# Patient Record
Sex: Male | Born: 1985 | Race: White | Hispanic: No | Marital: Single
Health system: Southern US, Community
[De-identification: ages and names within clinical notes are randomized; demographics above are authoritative.]

---

## 2006-07-29 ENCOUNTER — Emergency Department: Payer: Self-pay | Admitting: Emergency Medicine
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
## 2015-01-31 LAB — HM HIV SCREENING LAB: HM HIV Screening: NEGATIVE

## 2019-07-05 ENCOUNTER — Ambulatory Visit: Payer: Self-pay

## 2019-08-20 ENCOUNTER — Other Ambulatory Visit: Payer: Self-pay

## 2019-08-20 DIAGNOSIS — Z20822 Contact with and (suspected) exposure to covid-19: Secondary | ICD-10-CM

## 2019-08-21 LAB — NOVEL CORONAVIRUS, NAA: SARS-CoV-2, NAA: NOT DETECTED

## 2020-06-19 ENCOUNTER — Emergency Department: Payer: Commercial Managed Care - PPO

## 2020-06-19 ENCOUNTER — Emergency Department
Admission: EM | Admit: 2020-06-19 | Discharge: 2020-06-19 | Disposition: A | Discharge status: Home / Self Care | Payer: Commercial Managed Care - PPO | Attending: Student in an Organized Health Care Education/Training Program | Admitting: Student in an Organized Health Care Education/Training Program

## 2020-06-19 ENCOUNTER — Encounter: Payer: Self-pay | Admitting: Emergency Medicine

## 2020-06-19 ENCOUNTER — Other Ambulatory Visit: Payer: Self-pay

## 2020-06-19 DIAGNOSIS — M542 Cervicalgia: Secondary | ICD-10-CM | POA: Diagnosis not present

## 2020-06-19 DIAGNOSIS — Y9389 Activity, other specified: Secondary | ICD-10-CM | POA: Diagnosis not present

## 2020-06-19 DIAGNOSIS — Y999 Unspecified external cause status: Secondary | ICD-10-CM | POA: Insufficient documentation

## 2020-06-19 DIAGNOSIS — Z041 Encounter for examination and observation following transport accident: Secondary | ICD-10-CM | POA: Diagnosis not present

## 2020-06-19 DIAGNOSIS — Y92410 Unspecified street and highway as the place of occurrence of the external cause: Secondary | ICD-10-CM | POA: Diagnosis not present

## 2020-06-19 MED ORDER — CYCLOBENZAPRINE HCL 5 MG PO TABS: 5.0000 mg | ORAL_TABLET | Freq: Three times a day (TID) | ORAL | 0 refills | Status: AC | PRN
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
